# Patient Record
Sex: Female | Born: 2000 | Race: Black or African American | Hispanic: No | Marital: Single | Smoking: Never smoker
Health system: Southern US, Community
[De-identification: ages and names within clinical notes are randomized; demographics above are authoritative.]

## PROBLEM LIST (undated history)

## (undated) ENCOUNTER — Ambulatory Visit: Discharge status: Absent without leave

## (undated) DIAGNOSIS — D693 Immune thrombocytopenic purpura: Secondary | ICD-10-CM

## (undated) HISTORY — PX: WISDOM TOOTH EXTRACTION: SHX21

---
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
## 2019-03-29 DIAGNOSIS — H5213 Myopia, bilateral: Secondary | ICD-10-CM | POA: Diagnosis not present

## 2019-04-21 DIAGNOSIS — H5213 Myopia, bilateral: Secondary | ICD-10-CM | POA: Diagnosis not present

## 2019-04-21 DIAGNOSIS — H52223 Regular astigmatism, bilateral: Secondary | ICD-10-CM | POA: Diagnosis not present

## 2019-10-26 ENCOUNTER — Ambulatory Visit
Admission: EM | Admit: 2019-10-26 | Discharge: 2019-10-26 | Disposition: A | Discharge status: Home / Self Care | Attending: Emergency Medicine | Admitting: Emergency Medicine

## 2019-10-26 ENCOUNTER — Other Ambulatory Visit: Payer: Self-pay

## 2019-10-26 DIAGNOSIS — Z20822 Contact with and (suspected) exposure to covid-19: Secondary | ICD-10-CM

## 2019-10-26 DIAGNOSIS — Z0189 Encounter for other specified special examinations: Secondary | ICD-10-CM

## 2019-10-26 NOTE — Discharge Instructions (Signed)

## 2019-10-26 NOTE — ED Triage Notes (Signed)
Pt presents to [facility] for covid test. Denies exposure or symptoms.

## 2019-10-26 NOTE — ED Provider Notes (Signed)
[facility]   [id number] 10/26/19 Arrival Time: 1448   CC: COVID test  SUBJECTIVE: History from: patient.  STEPHENIA VOGAN is a 19 y.o. female who presents for COVID testing.  Denies sick exposure to COVID, flu or strep.  Denies recent travel.  Denies aggravating or alleviating symptoms.  Denies previous COVID infection.   Denies fever, chills, fatigue, nasal congestion, rhinorrhea, sore throat, cough, SOB, wheezing, chest pain, nausea, vomiting, changes in bowel or bladder habits.     ROS: As per HPI.  All other pertinent ROS negative.     Past Medical History:  Diagnosis Date  . ITP (idiopathic thrombocytopenic purpura)    Past Surgical History:  Procedure Laterality Date  . WISDOM TOOTH EXTRACTION     Allergies  Allergen Reactions  . Penicillins    No current facility-administered medications on file prior to encounter.   No current outpatient medications on file prior to encounter.   Social History   Socioeconomic History  . Marital status: Single    Spouse name: Not on file  . Number of children: Not on file  . Years of education: Not on file  . Highest education level: Not on file  Occupational History  . Not on file  Tobacco Use  . Smoking status: Never Smoker  . Smokeless tobacco: Never Used  Substance and Sexual Activity  . Alcohol use: No  . Drug use: No  . Sexual activity: Not on file  Other Topics Concern  . Not on file  Social History Narrative  . Not on file   Social Determinants of Health   Financial Resource Strain:   . Difficulty of Paying Living Expenses: Not on file  Food Insecurity:   . Worried About Programme researcher, broadcasting/film/video in the Last Year: Not on file  . Ran Out of Food in the Last Year: Not on file  Transportation Needs:   . Lack of Transportation (Medical): Not on file  . Lack of Transportation (Non-Medical): Not on file  Physical Activity:   . Days of Exercise per Week: Not on file  . Minutes of Exercise per Session: Not  on file  Stress:   . Feeling of Stress : Not on file  Social Connections:   . Frequency of Communication with Friends and Family: Not on file  . Frequency of Social Gatherings with Friends and Family: Not on file  . Attends Religious Services: Not on file  . Active Member of Clubs or Organizations: Not on file  . Attends Banker Meetings: Not on file  . Marital Status: Not on file  Intimate Partner Violence:   . Fear of Current or Ex-Partner: Not on file  . Emotionally Abused: Not on file  . Physically Abused: Not on file  . Sexually Abused: Not on file   Family History  Problem Relation Age of Onset  . Healthy Mother   . Healthy Father     OBJECTIVE:  Vitals:   10/26/19 1511  BP: 130/75  Pulse: 82  Resp: 17  Temp: 98.5 F (36.9 C)  TempSrc: Tympanic  SpO2: 96%     General appearance: alert; well-appearing, nontoxic; speaking in full sentences and tolerating own secretions HEENT: NCAT; Ears: EACs clear, TMs pearly gray; Eyes: PERRL.  EOM grossly intact Nose: nares patent without rhinorrhea, Throat: oropharynx clear, tonsils non erythematous or enlarged, uvula midline  Neck: supple without LAD Lungs: unlabored respirations, symmetrical air entry; cough: absent; no respiratory distress; CTAB Heart: regular rate  and rhythm.  Skin: warm and dry Psychological: alert and cooperative; normal mood and affect   ASSESSMENT & PLAN:  1. Encounter for laboratory test   2. Suspected COVID-19 virus infection   3. Encounter for laboratory testing for COVID-19 virus    COVID testing ordered.  It will take between 5-7 days for test results.  Someone will contact you regarding abnormal results.    In the meantime: You should remain isolated in your home for 10 days from symptom onset AND greater than 72 hours after symptoms resolution (absence of fever without the use of fever-reducing medication and improvement in respiratory symptoms), whichever is longer OR 14 days  from exposure Get plenty of rest and push fluids Use OTC zyrtec for nasal congestion, runny nose, and/or sore throat Use OTC flonase for nasal congestion and runny nose Use medications daily for symptom relief Use OTC medications like ibuprofen or tylenol as needed fever or pain Call or go to the ED if you have any new or worsening symptoms such as fever, cough, shortness of breath, chest tightness, chest pain, turning blue, changes in mental status, etc...    Reviewed expectations re: course of current medical issues. Questions answered. Outlined signs and symptoms indicating need for more acute intervention. Patient verbalized understanding. After Visit Summary given.         Lestine Box, PA-C 10/26/19 1611

## 2019-10-27 LAB — NOVEL CORONAVIRUS, NAA: SARS-CoV-2, NAA: NOT DETECTED

## 2019-11-13 ENCOUNTER — Other Ambulatory Visit: Payer: Self-pay

## 2019-11-13 ENCOUNTER — Ambulatory Visit: Attending: Internal Medicine

## 2019-11-13 DIAGNOSIS — Z20822 Contact with and (suspected) exposure to covid-19: Secondary | ICD-10-CM

## 2019-11-14 LAB — NOVEL CORONAVIRUS, NAA: SARS-CoV-2, NAA: NOT DETECTED

## 2020-01-23 ENCOUNTER — Other Ambulatory Visit: Payer: Self-pay

## 2020-01-23 ENCOUNTER — Ambulatory Visit: Attending: Internal Medicine

## 2020-01-23 DIAGNOSIS — Z20822 Contact with and (suspected) exposure to covid-19: Secondary | ICD-10-CM

## 2020-01-24 LAB — SARS-COV-2, NAA 2 DAY TAT

## 2020-01-24 LAB — NOVEL CORONAVIRUS, NAA: SARS-CoV-2, NAA: NOT DETECTED

## 2020-01-26 ENCOUNTER — Encounter (HOSPITAL_COMMUNITY): Payer: Self-pay

## 2020-01-26 ENCOUNTER — Other Ambulatory Visit: Payer: Self-pay

## 2020-01-26 ENCOUNTER — Emergency Department (HOSPITAL_COMMUNITY)
Admission: EM | Admit: 2020-01-26 | Discharge: 2020-01-26 | Disposition: A | Discharge status: Home / Self Care | Attending: Emergency Medicine | Admitting: Emergency Medicine

## 2020-01-26 DIAGNOSIS — M542 Cervicalgia: Secondary | ICD-10-CM | POA: Diagnosis present

## 2020-01-26 MED ORDER — NAPROXEN 250 MG PO TABS
375.0000 mg | ORAL_TABLET | Freq: Once | ORAL | Status: AC
  Administered 2020-01-26: 09:00:00 375 mg via ORAL
  Filled 2020-01-26: qty 2

## 2020-01-26 MED ORDER — LIDOCAINE 5 % EX PTCH: 1.0000 | MEDICATED_PATCH | CUTANEOUS | 0 refills | Status: AC

## 2020-01-26 MED ORDER — LIDOCAINE 5 % EX PTCH
1.0000 | MEDICATED_PATCH | CUTANEOUS | Status: DC
  Administered 2020-01-26: 09:00:00 1 via TRANSDERMAL
  Filled 2020-01-26: qty 1

## 2020-01-26 MED ORDER — NAPROXEN 375 MG PO TABS: 375.0000 mg | ORAL_TABLET | Freq: Two times a day (BID) | ORAL | 0 refills | Status: AC | PRN

## 2020-01-26 NOTE — ED Triage Notes (Signed)
Pt reports left sided neck pain for past few days.  Pt says woke up this morning and felt something pop in left side of neck and pain has been worse since then.

## 2020-01-26 NOTE — ED Provider Notes (Signed)
[facility] EMERGENCY DEPARTMENT Provider Note   CSN: [id number] Arrival date & time: 01/26/20  5329     History Chief Complaint  Patient presents with  . Neck Pain    Tammie Barber is a 19 y.o. female with a history of ITP who presents to the emergency department with complaints of neck pain for the past few days that worsened this morning.  Patient states the pain is to the left side of her neck, was mild, this morning while in bed she felt a popping sensation to the left side of the neck with worsening pain.  States it feels like it is stiff.  Pain is worse with movement of the neck as well as the left shoulder.  No alleviating factors.  No intervention prior to arrival.  No traumatic injury or change in activity. Denies numbness, tingling, weakness, saddle anesthesia, incontinence to bowel/bladder, fever, chills, IV drug use, dysuria, or hx of cancer. Patient has not had prior back surgeries.   HPI     Past Medical History:  Diagnosis Date  . ITP (idiopathic thrombocytopenic purpura)     Patient Active Problem List   Diagnosis Date Noted  . 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 WISDOM TOOTH EXTRACTION       OB History   No obstetric history on file.     Family History  Problem Relation Age of Onset  . Healthy Mother   . Healthy Father     Social History   Tobacco Use  . Smoking status: Never Smoker  . Smokeless tobacco: Never Used  Substance Use Topics  . Alcohol use: No  . Drug use: No    Home Medications Prior to Admission medications   Not on File    Allergies    Penicillins  Review of Systems   Review of Systems  Constitutional: Negative for chills and fever.  Eyes: Negative for visual disturbance.  Respiratory: Negative for shortness of breath.   Cardiovascular: Negative for chest pain.  Gastrointestinal: Negative for abdominal pain, nausea and vomiting.  Musculoskeletal:  Positive for neck pain.  Neurological: Negative for dizziness, weakness, numbness and headaches.       Negative for incontinence or saddle anesthesia.     Physical Exam Updated Vital Signs BP (!) 134/98 (BP Location: Right Arm)   Pulse 90   Temp 98.5 F (36.9 C) (Oral)   Resp 12   Ht 5\' 8"  (1.727 m)   Wt 86.2 kg   LMP 01/19/2020 (Approximate)   SpO2 100%   BMI 28.89 kg/m   Physical Exam Vitals and nursing note reviewed.  Constitutional:      General: She is not in acute distress.    Appearance: Normal appearance. She is well-developed. She is not ill-appearing or toxic-appearing.  HENT:     Head: Normocephalic and atraumatic.  Eyes:     General:        Right eye: No discharge.        Left eye: No discharge.     Conjunctiva/sclera: Conjunctivae normal.  Neck:     Comments: ROM intact. No point/focal vertebral tenderness. Tender to palpation to the L cervical paraspinal muscles.  Cardiovascular:     Rate and Rhythm: Normal rate and regular rhythm.     Pulses:          Radial pulses are 2+ on the right side and 2+ on the left side.  Pulmonary:  Effort: Pulmonary effort is normal. No respiratory distress.     Breath sounds: Normal breath sounds. No wheezing, rhonchi or rales.  Abdominal:     General: There is no distension.     Palpations: Abdomen is soft.     Tenderness: There is no abdominal tenderness.  Musculoskeletal:     Cervical back: Neck supple. No rigidity.     Comments: Upper extremities: No obvious deformity, appreciable swelling, edema, erythema, ecchymosis, warmth, or open wounds. Patient has intact AROM throughout.  Tender to palpation over the L trapezius, otherwise nontender, no focal bony tenderness noted.  No midline spinal tenderness.   Skin:    General: Skin is warm and dry.     Capillary Refill: Capillary refill takes less than 2 seconds.     Findings: No rash.  Neurological:     Mental Status: She is alert.     Comments: Alert. Clear speech.  CN III-XII grossly intact. Sensation grossly intact to bilateral upper/lower extremities. 5/5 symmetric grip strength & strength with plantar/dorsiflexion bilaterally. Ambulatory.   Psychiatric:        Mood and Affect: Mood normal.        Behavior: Behavior normal.     ED Results / Procedures / Treatments   Labs (all labs ordered are listed, but only abnormal results are displayed) Labs Reviewed - No data to display  EKG None  Radiology No results found.  Procedures Procedures (including critical care time)  Medications Ordered in ED Medications - No data to display  ED Course  I have reviewed the triage vital signs and the nursing notes.  Pertinent labs & imaging results that were available during my care of the patient were reviewed by me and considered in my medical decision making (see chart for details).    MDM Rules/Calculators/A&P                      Patient presents to the emergency department with complaints of neck pain over the past few days, worsened with a popping sensation this morning.  Nontoxic, resting comfortably, vitals WNL with exception of mildly elevated BP, doubt HTN emergency.  No direct trauma, no focal midline tenderness, do not suspect spinal fracture/subluxation.  Afebrile, no history of IVDU, do not suspect epidural abscess, cord rigidity do not suspect meningitis.  Pain is in the neck, no neurologic deficits, ambulatory, do not suspect cauda equina.  Pain is reproducible with left sided cervical paraspinal muscle palpation. Suspect muscle strain/spasm- will treat with naproxen & lidoderm patches. Recommended application of heat. PCP follow up. I discussed treatment plan, need for follow-up, and return precautions with the patient. Provided opportunity for questions, patient confirmed understanding and is in agreement with plan.   Final Clinical Impression(s) / ED Diagnoses Final diagnoses:  Neck pain    Rx / DC Orders ED Discharge Orders          Ordered    lidocaine (LIDODERM) 5 %  Every 24 hours     01/26/20 0900    naproxen (NAPROSYN) 375 MG tablet  2 times daily PRN     01/26/20 0901           Cherly Anderson, PA-C 01/26/20 0906    Sabas Sous, MD 01/27/20 1239

## 2020-01-26 NOTE — Discharge Instructions (Signed)
You were seen in the ER today for neck pain.  We suspect this is related to a muscle strain/spasm.  We are sending you home with the following medicines:  - Naproxen is a nonsteroidal anti-inflammatory medication that will help with pain and swelling. Be sure to take this medication as prescribed with food, 1 pill every 12 hours,  It should be taken with food, as it can cause stomach upset, and more seriously, stomach bleeding. Do not take other nonsteroidal anti-inflammatory medications with this such as Advil, Motrin, Aleve, Mobic, Goodie Powder, or Motrin.    - Lidoderm patch- apply 1 patch to the left side of your neck daily to help with muscle spasm.   You make take Tylenol per over the counter dosing with these medications.   We have prescribed you new medication(s) today. Discuss the medications prescribed today with your pharmacist as they can have adverse effects and interactions with your other medicines including over the counter and prescribed medications. Seek medical evaluation if you start to experience new or abnormal symptoms after taking one of these medicines, seek care immediately if you start to experience difficulty breathing, feeling of your throat closing, facial swelling, or rash as these could be indications of a more serious allergic reaction   Apply heat to the left side of your neck as this may help loosen the muscle as well.  Follow with your primary care provider within 3 days for reevaluation.  If you do not have a primary care provider please see local options listed below.  Return to the ER for new or worsening symptoms including but not limited to increased pain, numbness, weakness, fever, loss of control of bowel or bladder function, change in your vision, or any other concern.  [facility] Primary Care Doctor List    Kari Baars MD. Specialty: Pulmonary Disease Contact information: [address]  [address]  [place] [address]  [phone]    Syliva Overman, MD. Specialty: [facility] Contact information: [address]  [place] [address]  [phone]   Lilyan Punt, MD. Specialty: [facility] Contact information: [address]  [place] [address]  [phone]   Avon Gully, MD Specialty: Internal Medicine Contact information: [address] [place] [address]  [phone]   Catalina Pizza, MD. Specialty: Internal Medicine Contact information: [address]  [place] [address]  [phone]    [facility] (Dr. Selena Batten) Specialty: Family Medicine Contact information: [address]  [place] [address]  [phone]   John Giovanni, MD. Specialty: [facility] Contact information: [address]  [address]  [place] [address]  [phone]   Carylon Perches, MD. Specialty: Internal Medicine Contact information: [address]  [address]  [place] [address]  [phone]    [facility]  [address] [place], [place] [address] [phone]  Services [facility] offers a variety of basic health services.  Services include but are not limited to: Blood pressure checks  Heart rate checks  Blood sugar checks  Urine analysis  Rapid strep tests  Pregnancy tests.  Health education and referrals  People needing more complex services will be directed to a physician online. Using these virtual visits, doctors can evaluate and prescribe medicine and treatments. There will be no medication on-site, though [facility] will help patients fill their prescriptions at  little to no cost.   For More information please go to: [email or web address]

## 2020-07-11 ENCOUNTER — Other Ambulatory Visit: Payer: Self-pay

## 2020-07-11 ENCOUNTER — Ambulatory Visit
Admission: EM | Admit: 2020-07-11 | Discharge: 2020-07-11 | Disposition: A | Discharge status: Home / Self Care | Attending: Emergency Medicine | Admitting: Emergency Medicine

## 2020-07-11 ENCOUNTER — Encounter: Payer: Self-pay | Admitting: Emergency Medicine

## 2020-07-11 DIAGNOSIS — J039 Acute tonsillitis, unspecified: Secondary | ICD-10-CM

## 2020-07-11 DIAGNOSIS — R509 Fever, unspecified: Secondary | ICD-10-CM | POA: Diagnosis not present

## 2020-07-11 DIAGNOSIS — Z1152 Encounter for screening for COVID-19: Secondary | ICD-10-CM | POA: Diagnosis present

## 2020-07-11 LAB — POCT RAPID STREP A (OFFICE): Rapid Strep A Screen: NEGATIVE

## 2020-07-11 MED ORDER — AZITHROMYCIN 250 MG PO TABS: 250.0000 mg | ORAL_TABLET | Freq: Every day | ORAL | 0 refills | Status: AC

## 2020-07-11 NOTE — Discharge Instructions (Signed)
COVID testing ordered.  It will take between 2-7 days for test results.  Someone will contact you regarding abnormal results.    In the meantime: You should remain isolated in your home for 10 days from symptom onset AND greater than 24 hours after symptoms resolution (absence of fever without the use of fever-reducing medication and improvement in respiratory symptoms), whichever is longer Get plenty of rest and push fluids Azithromycin were prescribed for possible tonsillitis Use throat lozenges to soothe your throat Use medications daily for symptom relief Use OTC medications like ibuprofen or tylenol as needed fever or pain Call or go to the ED if you have any new or worsening symptoms such as fever, worsening cough, shortness of breath, chest tightness, chest pain, turning blue, changes in mental status, etc..Marland Kitchen

## 2020-07-11 NOTE — ED Triage Notes (Signed)
Chills, fever, sore throat and upper back pain that started last night. Pt states her temp was 102 around 0830 took 2 extra strength tylenol.

## 2020-07-11 NOTE — ED Provider Notes (Signed)
Back pain [facility]   [id number] 07/11/20 Arrival Time: 0857   CC: COVID symptoms  SUBJECTIVE: History from: patient.  Tammie Barber is a 19 y.o. female presented to the urgent care for complaint of chills, fever, sore throat that started last night.  Reported fever of 102 F at home, 99.5 F in office today.  Denies sick exposure to COVID, flu or strep.  Denies recent travel.  Has tried OTC medication without relief.  Denies aggravating factors.  Denies previous symptoms in the past.   Denies fever, chills, fatigue, sinus pain, rhinorrhea, sore throat, SOB, wheezing, chest pain, nausea, changes in bowel or bladder habits.      ROS: As per HPI.  All other pertinent ROS negative.     Past Medical History:  Diagnosis Date  . ITP (idiopathic thrombocytopenic purpura)    Past Surgical History:  Procedure Laterality Date  . WISDOM TOOTH EXTRACTION     Allergies  Allergen Reactions  . Penicillins    No current facility-administered medications on file prior to encounter.   Current Outpatient Medications on File Prior to Encounter  Medication Sig Dispense Refill  . lidocaine (LIDODERM) 5 % Place 1 patch onto the skin daily. Apply patch to the left side of your neck once per day. Remove & Discard patch within 12 hours. 15 patch 0  . naproxen (NAPROSYN) 375 MG tablet Take 1 tablet (375 mg total) by mouth 2 (two) times daily as needed for moderate pain. 10 tablet 0   Social History   Socioeconomic History  . Marital status: Single    Spouse name: Not on file  . Number of children: Not on file  . Years of education: Not on file  . Highest education level: Not on file  Occupational History  . Not on file  Tobacco Use  . Smoking status: Never Smoker  . Smokeless tobacco: Never Used  Vaping Use  . Vaping Use: Never used  Substance and Sexual Activity  . Alcohol use: No  . Drug use: No  . Sexual activity: Not on file  Other Topics Concern  . Not on file  Social  History Narrative  . Not on file   Social Determinants of Health   Financial Resource Strain:   . Difficulty of Paying Living Expenses: Not on file  Food Insecurity:   . Worried About Programme researcher, broadcasting/film/video in the Last Year: Not on file  . Ran Out of Food in the Last Year: Not on file  Transportation Needs:   . Lack of Transportation (Medical): Not on file  . Lack of Transportation (Non-Medical): Not on file  Physical Activity:   . Days of Exercise per Week: Not on file  . Minutes of Exercise per Session: Not on file  Stress:   . Feeling of Stress : Not on file  Social Connections:   . Frequency of Communication with Friends and Family: Not on file  . Frequency of Social Gatherings with Friends and Family: Not on file  . Attends Religious Services: Not on file  . Active Member of Clubs or Organizations: Not on file  . Attends Banker Meetings: Not on file  . Marital Status: Not on file  Intimate Partner Violence:   . Fear of Current or Ex-Partner: Not on file  . Emotionally Abused: Not on file  . Physically Abused: Not on file  . Sexually Abused: Not on file   Family History  Problem Relation Age of Onset  .  Healthy Mother   . Healthy Father     OBJECTIVE:  Vitals:   07/11/20 0938 07/11/20 0939  BP: 113/74   Pulse: (!) 125   Resp: 18   Temp: 99.5 F (37.5 C)   TempSrc: Oral   SpO2: 96%   Weight:  210 lb (95.3 kg)  Height:  5\' 8"  (1.727 m)     General appearance: alert; appears fatigued, but nontoxic; speaking in full sentences and tolerating own secretions HEENT: NCAT; Ears: EACs clear, TMs pearly gray; Eyes: PERRL.  EOM grossly intact. Sinuses: nontender; Nose: nares patent without rhinorrhea, Throat: oropharynx clear, 2+ tonsils  erythematous with white exudate, uvula midline  Neck: supple without LAD Lungs: unlabored respirations, symmetrical air entry; cough: mild; no respiratory distress; CTAB Heart: regular rate and rhythm.  Radial pulses 2+  symmetrical bilaterally Skin: warm and dry Psychological: alert and cooperative; normal mood and affect  LABS:  Results for orders placed or performed during the hospital encounter of 07/11/20 (from the past 24 hour(s))  POCT rapid strep A     Status: None   Collection Time: 07/11/20  9:47 AM  Result Value Ref Range   Rapid Strep A Screen Negative Negative     ASSESSMENT & PLAN:  1. Fever, unspecified   2. Acute tonsillitis, unspecified etiology   3. Encounter for screening for COVID-19     Meds ordered this encounter  Medications  . azithromycin (ZITHROMAX) 250 MG tablet    Sig: Take 1 tablet (250 mg total) by mouth daily. Take first 2 tablets together, then 1 every day until finished.    Dispense:  6 tablet    Refill:  0   Patient is stable at discharge.  COVID-19 test was completed for rule out.  Discharge instructions  COVID testing ordered.  It will take between 2-7 days for test results.  Someone will contact you regarding abnormal results.    In the meantime: You should remain isolated in your home for 10 days from symptom onset AND greater than 24 hours after symptoms resolution (absence of fever without the use of fever-reducing medication and improvement in respiratory symptoms), whichever is longer Get plenty of rest and push fluids Azithromycin were prescribed for possible tonsillitis Use throat lozenges to soothe your throat Use medications daily for symptom relief Use OTC medications like ibuprofen or tylenol as needed fever or pain Call or go to the ED if you have any new or worsening symptoms such as fever, worsening cough, shortness of breath, chest tightness, chest pain, turning blue, changes in mental status, etc...   Reviewed expectations re: course of current medical issues. Questions answered. Outlined signs and symptoms indicating need for more acute intervention. Patient verbalized understanding. After Visit Summary given.         07/13/20, FNP 07/11/20 1024

## 2020-07-12 LAB — NOVEL CORONAVIRUS, NAA: SARS-CoV-2, NAA: NOT DETECTED

## 2020-07-12 LAB — SARS-COV-2, NAA 2 DAY TAT

## 2020-07-14 LAB — CULTURE, GROUP A STREP (THRC)

## 2020-09-16 ENCOUNTER — Other Ambulatory Visit: Payer: Self-pay

## 2020-09-16 ENCOUNTER — Ambulatory Visit
Admission: EM | Admit: 2020-09-16 | Discharge: 2020-09-16 | Disposition: A | Discharge status: Home / Self Care | Attending: Family Medicine | Admitting: Family Medicine

## 2020-09-16 ENCOUNTER — Encounter: Payer: Self-pay | Admitting: Family Medicine

## 2020-09-16 DIAGNOSIS — Z20822 Contact with and (suspected) exposure to covid-19: Secondary | ICD-10-CM

## 2020-09-16 DIAGNOSIS — Z1152 Encounter for screening for COVID-19: Secondary | ICD-10-CM

## 2020-09-16 NOTE — ED Provider Notes (Signed)
[facility]    CSN: [id number] Arrival date & time: 09/16/20  1637      History   Chief Complaint Chief Complaint  Patient presents with  . URI    HPI Tammie Barber is a 19 y.o. female.   HPI   Encounter for COVID-19 Testing in Symptomatic Patient Recent Exposure to persons positive for COVID-19: Yes Experienced Fever:No Current Symptoms: Symptoms of nasal congestion Last COVID-19 Test: Positive home test today     Past Medical History:  Diagnosis Date  . ITP (idiopathic thrombocytopenic purpura)     Patient Active Problem List   Diagnosis Date Noted  . 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 WISDOM TOOTH EXTRACTION      OB History   No obstetric history on file.      Home Medications    Prior to Admission medications   Medication Sig Start Date End Date Taking? Authorizing Provider  azithromycin (ZITHROMAX) 250 MG tablet Take 1 tablet (250 mg total) by mouth daily. Take first 2 tablets together, then 1 every day until finished. 07/11/20   Avegno, Zachery Dakins, FNP  lidocaine (LIDODERM) 5 % Place 1 patch onto the skin daily. Apply patch to the left side of your neck once per day. Remove & Discard patch within 12 hours. 01/26/20   Petrucelli, Samantha R, PA-C  naproxen (NAPROSYN) 375 MG tablet Take 1 tablet (375 mg total) by mouth 2 (two) times daily as needed for moderate pain. 01/26/20   Petrucelli, Pleas Koch, PA-C    Family History Family History  Problem Relation Age of Onset  . Healthy Mother   . Healthy Father     Social History Social History   Tobacco Use  . Smoking status: Never Smoker  . Smokeless tobacco: Never Used  Vaping Use  . Vaping Use: Never used  Substance Use Topics  . Alcohol use: No  . Drug use: No     Allergies   Penicillins   Review of Systems Review of Systems Pertinent negatives listed in HPI   Physical Exam Triage Vital Signs ED  Triage Vitals  Enc Vitals Group     BP 09/16/20 1806 111/78     Pulse Rate 09/16/20 1806 78     Resp 09/16/20 1806 16     Temp 09/16/20 1806 98 F (36.7 C)     Temp Source 09/16/20 1806 Oral     SpO2 09/16/20 1806 97 %     Weight --      Height --      Head Circumference --      Peak Flow --      Pain Score 09/16/20 1810 0     Pain Loc --      Pain Edu? --      Excl. in GC? --    No data found.  Updated Vital Signs BP 111/78 (BP Location: Right Arm)   Pulse 78   Temp 98 F (36.7 C) (Oral)   Resp 16   LMP 08/26/2020   SpO2 97%   Visual Acuity Right Eye Distance:   Left Eye Distance:   Bilateral Distance:    Right Eye Near:   Left Eye Near:    Bilateral Near:     Physical Exam General appearance: alert, cooperative and in no distress Head: Normocephalic, without obvious abnormality, atraumatic Nasal congestion present  Respiratory: Respirations even and unlabored, normal respiratory rate Heart:  rate and rhythm normal. No gallop or murmurs noted on exam  Abdomen: BS +, no distention, no rebound tenderness, or no mass Extremities: No gross deformities Skin: Skin color, texture, turgor normal. No rashes seen  Psych: Appropriate mood and affect UC Treatments / Results  Labs (all labs ordered are listed, but only abnormal results are displayed) Labs Reviewed  NOVEL CORONAVIRUS, NAA    EKG   Radiology No results found.  Procedures Procedures (including critical care time)  Medications Ordered in UC Medications - No data to display  Initial Impression / Assessment and Plan / UC Course  I have reviewed the triage vital signs and the nursing notes.  Pertinent labs & imaging results that were available during my care of the patient were reviewed by me and considered in my medical decision making (see chart for details).    Consider home test positive. PCR COVID Test pending. Encouraged hydration with water. Encouraged deep breathing exercises. Avoid  social interaction as you are still symptomatic.  Patient advised that he should consider himself infected until he is symptom free for 72 hours. Advised to go immediately to the ER if symptoms worsen or do not improve.  Final Clinical Impressions(s) / UC Diagnoses   Final diagnoses:  Encounter for screening for COVID-19     Discharge Instructions     Quarantine until results are known . 10 days quarantine if test is positive    ED Prescriptions    None     PDMP not reviewed this encounter.   Bing Neighbors, FNP 09/16/20 1821

## 2020-09-16 NOTE — ED Triage Notes (Signed)
Patient has a stuffy nose for 2 days.  Patient mother and grandmother live together.  They all took a home covid test today.  2 of the 3 tests were positive, including this patient.

## 2020-09-16 NOTE — Discharge Instructions (Addendum)
Your COVID 19 results will be available in 48-72 hours. Negative results are immediately resulted to Mychart. Positive results will receive a follow-up call from our clinic. If symptoms are present, I recommend home quarantine until results are known.  

## 2020-09-18 LAB — NOVEL CORONAVIRUS, NAA: SARS-CoV-2, NAA: DETECTED — AB

## 2020-09-18 LAB — SARS-COV-2, NAA 2 DAY TAT

## 2020-09-21 ENCOUNTER — Telehealth (HOSPITAL_COMMUNITY): Payer: Self-pay

## 2020-09-21 ENCOUNTER — Encounter (HOSPITAL_COMMUNITY): Payer: Self-pay

## 2021-02-12 ENCOUNTER — Emergency Department (HOSPITAL_COMMUNITY)

## 2021-02-12 ENCOUNTER — Ambulatory Visit: Admission: EM | Admit: 2021-02-12 | Discharge: 2021-02-12 | Disposition: A | Discharge status: Home / Self Care

## 2021-02-12 ENCOUNTER — Emergency Department (HOSPITAL_COMMUNITY)
Admission: EM | Admit: 2021-02-12 | Discharge: 2021-02-12 | Disposition: A | Discharge status: Home / Self Care | Attending: Emergency Medicine | Admitting: Emergency Medicine

## 2021-02-12 ENCOUNTER — Encounter (HOSPITAL_COMMUNITY): Payer: Self-pay

## 2021-02-12 ENCOUNTER — Other Ambulatory Visit: Payer: Self-pay

## 2021-02-12 ENCOUNTER — Encounter: Payer: Self-pay | Admitting: Emergency Medicine

## 2021-02-12 DIAGNOSIS — H538 Other visual disturbances: Secondary | ICD-10-CM

## 2021-02-12 DIAGNOSIS — R42 Dizziness and giddiness: Secondary | ICD-10-CM | POA: Diagnosis present

## 2021-02-12 DIAGNOSIS — R110 Nausea: Secondary | ICD-10-CM

## 2021-02-12 LAB — CBC WITH DIFFERENTIAL/PLATELET
Abs Immature Granulocytes: 0.01 10*3/uL (ref 0.00–0.07)
Basophils Absolute: 0.1 10*3/uL (ref 0.0–0.1)
Basophils Relative: 1 %
Eosinophils Absolute: 0.1 10*3/uL (ref 0.0–0.5)
Eosinophils Relative: 1 %
HCT: 33.4 % — ABNORMAL LOW (ref 36.0–46.0)
Hemoglobin: 10.4 g/dL — ABNORMAL LOW (ref 12.0–15.0)
Immature Granulocytes: 0 %
Lymphocytes Relative: 31 %
Lymphs Abs: 2.1 10*3/uL (ref 0.7–4.0)
MCH: 27.2 pg (ref 26.0–34.0)
MCHC: 31.1 g/dL (ref 30.0–36.0)
MCV: 87.4 fL (ref 80.0–100.0)
Monocytes Absolute: 0.5 10*3/uL (ref 0.1–1.0)
Monocytes Relative: 7 %
Neutro Abs: 4.2 10*3/uL (ref 1.7–7.7)
Neutrophils Relative %: 60 %
Platelets: 322 10*3/uL (ref 150–400)
RBC: 3.82 MIL/uL — ABNORMAL LOW (ref 3.87–5.11)
RDW: 13.5 % (ref 11.5–15.5)
WBC: 6.9 10*3/uL (ref 4.0–10.5)
nRBC: 0 % (ref 0.0–0.2)

## 2021-02-12 LAB — COMPREHENSIVE METABOLIC PANEL
ALT: 11 U/L (ref 0–44)
AST: 13 U/L — ABNORMAL LOW (ref 15–41)
Albumin: 4.1 g/dL (ref 3.5–5.0)
Alkaline Phosphatase: 60 U/L (ref 38–126)
Anion gap: 6 (ref 5–15)
BUN: 9 mg/dL (ref 6–20)
CO2: 25 mmol/L (ref 22–32)
Calcium: 9.1 mg/dL (ref 8.9–10.3)
Chloride: 107 mmol/L (ref 98–111)
Creatinine, Ser: 0.62 mg/dL (ref 0.44–1.00)
GFR, Estimated: >60 mL/min (ref >60)
Glucose, Bld: 88 mg/dL (ref 70–99)
Potassium: 3.8 mmol/L (ref 3.5–5.1)
Sodium: 138 mmol/L (ref 135–145)
Total Bilirubin: 1.2 mg/dL (ref 0.3–1.2)
Total Protein: 7.3 g/dL (ref 6.5–8.1)

## 2021-02-12 LAB — TROPONIN I (HIGH SENSITIVITY): Troponin I (High Sensitivity): <2 ng/L (ref <18)

## 2021-02-12 LAB — PREGNANCY, URINE: Preg Test, Ur: NEGATIVE

## 2021-02-12 MED ORDER — SODIUM CHLORIDE 0.9 % IV BOLUS
1000.0000 mL | Freq: Once | INTRAVENOUS | Status: AC
  Administered 2021-02-12: 1000 mL via INTRAVENOUS

## 2021-02-12 NOTE — ED Notes (Signed)
C/o dizziness at random times, no vomiting, or numbness noted.

## 2021-02-12 NOTE — Discharge Instructions (Addendum)
I suspect your dehydrated causing your dizziness please continue to stay hydrated and continue with your medication as prescribed.    Lab work shows your hemoglobin was slightly low, possible for anemia like you to follow-up with your PCP for further evaluation.  Come back to the emergency department if you develop chest pain, shortness of breath, severe abdominal pain, uncontrolled nausea, vomiting, diarrhea.
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
## 2021-02-12 NOTE — ED Triage Notes (Signed)
Pt to the er, pt states that she is here for dizziness, states that she has been dizzy for the past two day, states that her dizziness is more constant at this time, pt states that she was seen at urgent care and sent to the ed for further eval.  states that the dizziness is random.

## 2021-02-12 NOTE — ED Notes (Signed)
Patient is being discharged from the Urgent Care and sent to the Emergency Department via pov . Per B. Wurst , patient is in need of higher level of care due to dizziness . Patient is aware and verbalizes understanding of plan of care.  Vitals:   02/12/21 1025  BP: 126/86  Pulse: 86  Resp: 16  Temp: 97.6 F (36.4 C)  SpO2: 96%

## 2021-02-12 NOTE — Discharge Instructions (Signed)
Recommending further evaluation and management in the ED given patient's symptoms of feeling like she is going to pass out, nausea, dizziness, and blurred vision.  Exam reassuring and vital signs stable in office.  Offered outpatient therapy for nausea, but patient declines at this time and would like a further work-up.  Cannot rule out brain bleed, immediate electrolyte abnormalities, or anemia in urgent care setting.  Patient aware and in agreement with plan.  Will go by private vehicle to [facility] ED.  Declines EMS transport.

## 2021-02-12 NOTE — ED Triage Notes (Addendum)
Dizziness, blurry vision and nausea for the past 2 days.  Pt does have some nasal drainage as well that started today.  Pt mentioned she has a hx of ITP but has not had any problems since she was a child.

## 2021-02-13 ENCOUNTER — Telehealth: Payer: Self-pay | Admitting: *Deleted

## 2021-02-13 NOTE — Telephone Encounter (Signed)
Transition Care Management Unsuccessful Follow-up Telephone Call  Date of discharge and from where:  02/11/2021 - [place] [facility]  Attempts:  1st Attempt  Reason for unsuccessful TCM follow-up call:  Left voice message 

## 2021-02-14 NOTE — Telephone Encounter (Signed)
Transition Care Management Unsuccessful Follow-up Telephone Call  Date of discharge and from where:  02/11/2021 - [facility]  Attempts:  2nd Attempt  Reason for unsuccessful TCM follow-up call:  Left voice message

## 2021-02-17 NOTE — Telephone Encounter (Signed)
Transition Care Management Unsuccessful Follow-up Telephone Call  Date of discharge and from where:  02/11/2021 from [facility]  Attempts:  3rd Attempt  Reason for unsuccessful TCM follow-up call:  Unable to reach patient

## 2021-11-13 IMAGING — DX DG CHEST 1V PORT
1 series · 1 of 1 positions shown · non-contrast
Comparison: 07/31/2006

CLINICAL DATA: Weakness dizzy

EXAM:
PORTABLE CHEST 1 VIEW

[chest ap]
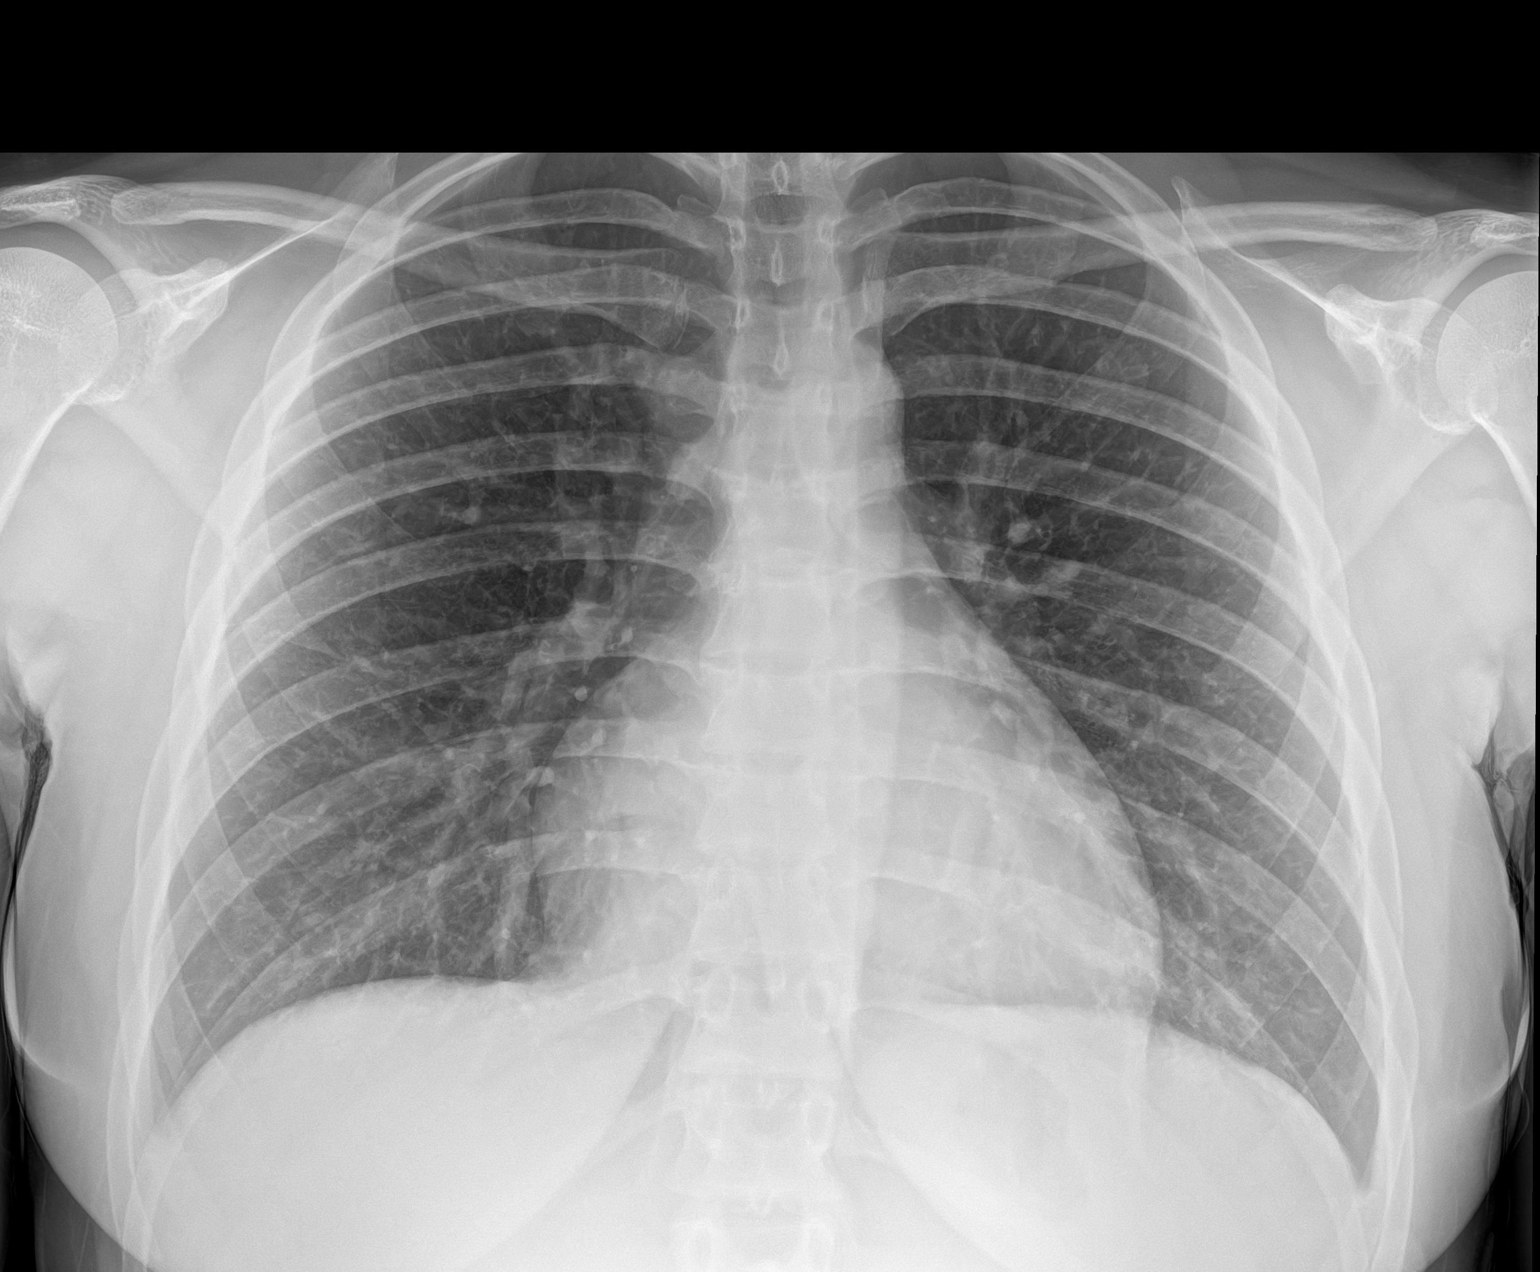

[1 of 1 positions shown; findings below may reference images not displayed]

FINDINGS: The heart size and mediastinal contours are within normal limits.
Both lungs are clear. The visualized skeletal structures are
unremarkable.
IMPRESSION: No active disease.

## 2021-11-13 IMAGING — CT CT HEAD W/O CM
3 series · 15 of 47 positions shown, 18 images · non-contrast
Comparison: None.

CLINICAL DATA: Weakness, dizzy

EXAM:
CT HEAD WITHOUT CONTRAST
TECHNIQUE: Contiguous axial images were obtained from the base of the skull
through the vertex without intravenous contrast.

[Series 2: head w o · axial · 0.44mm/px · z∈[+592,+737]mm · 9 of 35 slices shown, 12 images]
[im 3/35  brain]
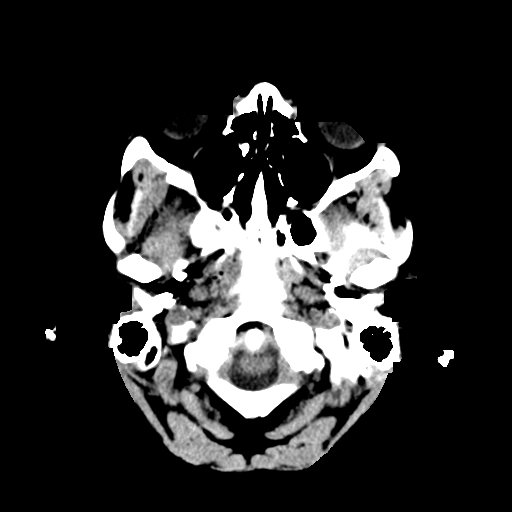
[im 3/35  bone]
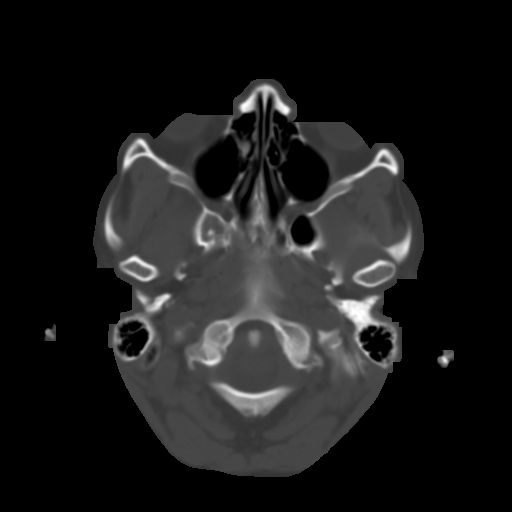
[im 6/35  brain]
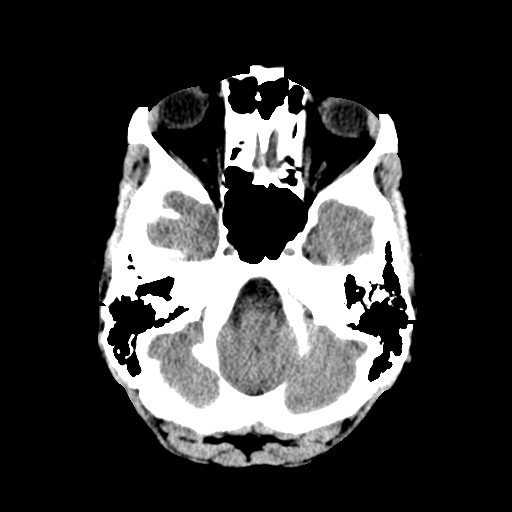
[im 10/35  brain]
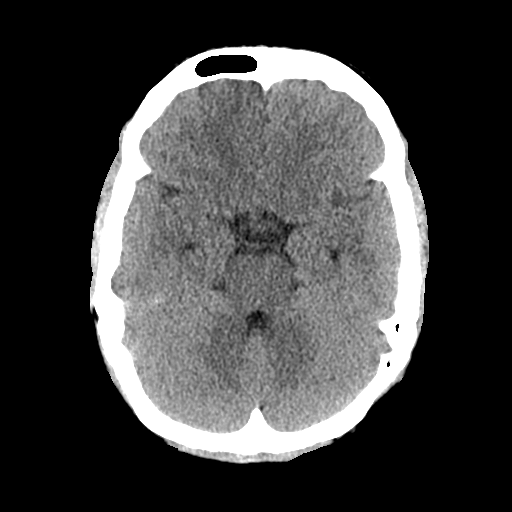
[im 13/35  brain]
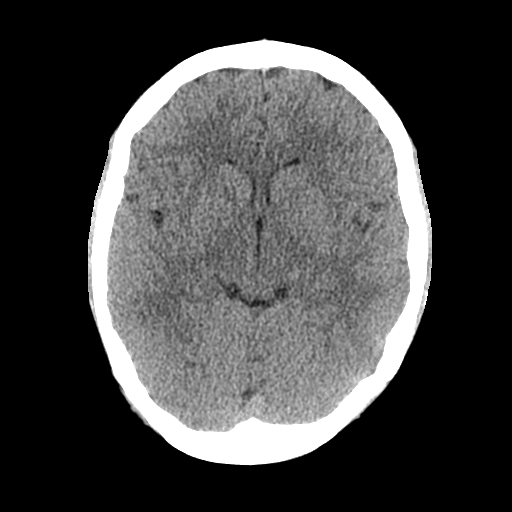
[im 18/35  brain]
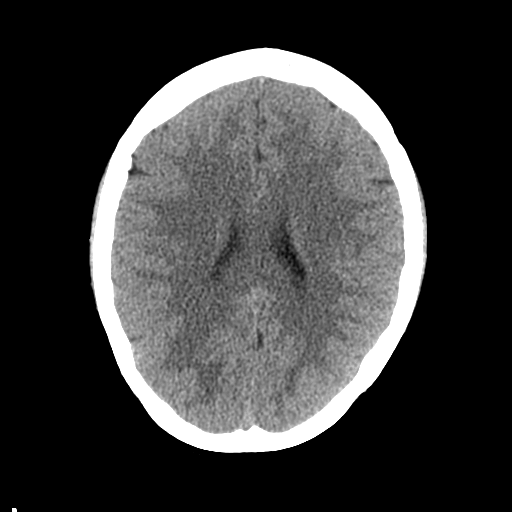
[im 18/35  bone]
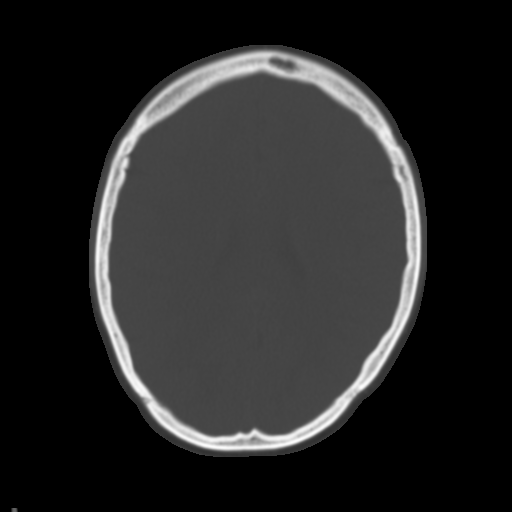
[im 22/35  brain]
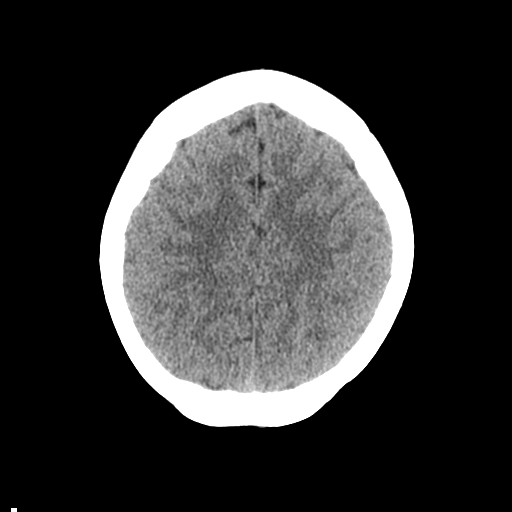
[im 25/35  brain]
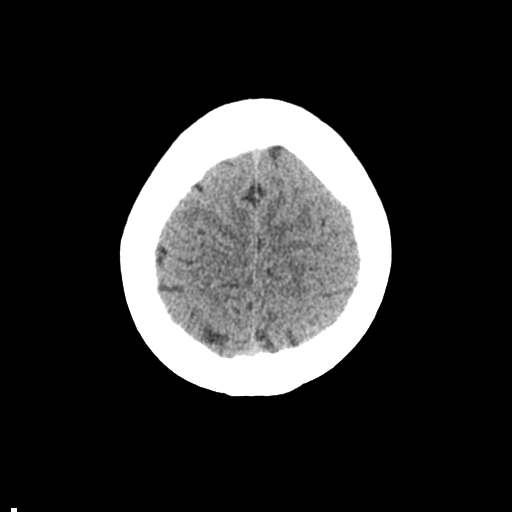
[im 29/35  brain]
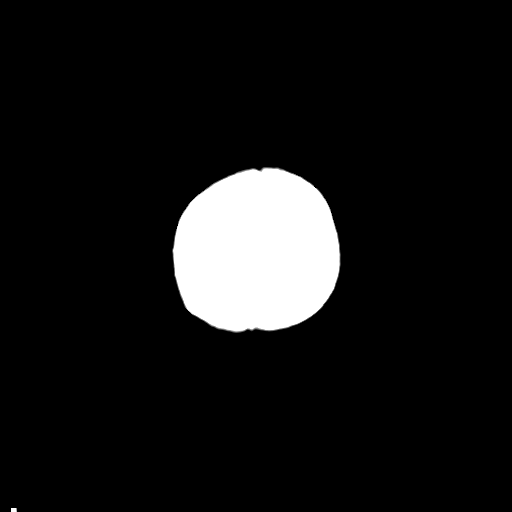
[im 32/35  brain]
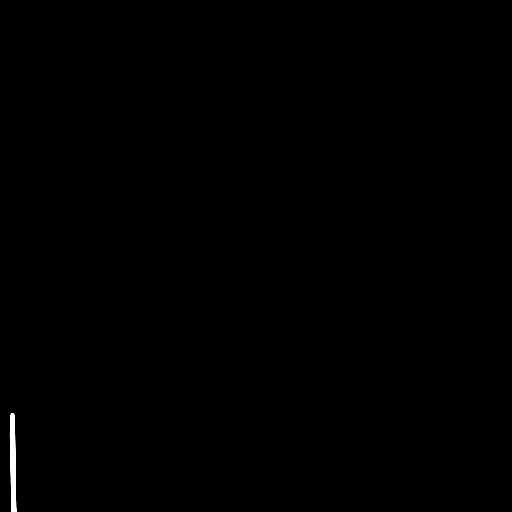
[im 32/35  bone]
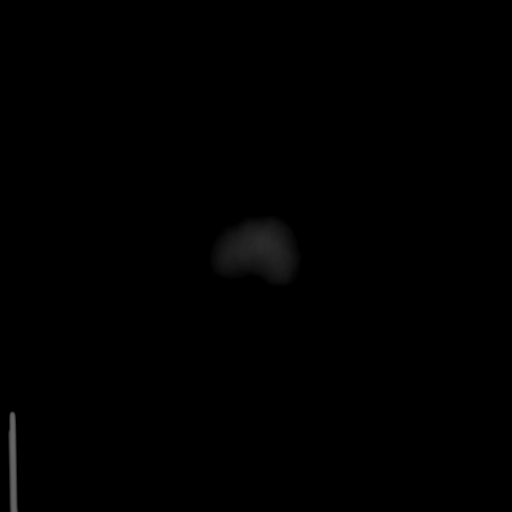

[Series 4: coronal soft · coronal · 0.31mm/px · 3 of 70 slices shown]
[im 24/70  brain]
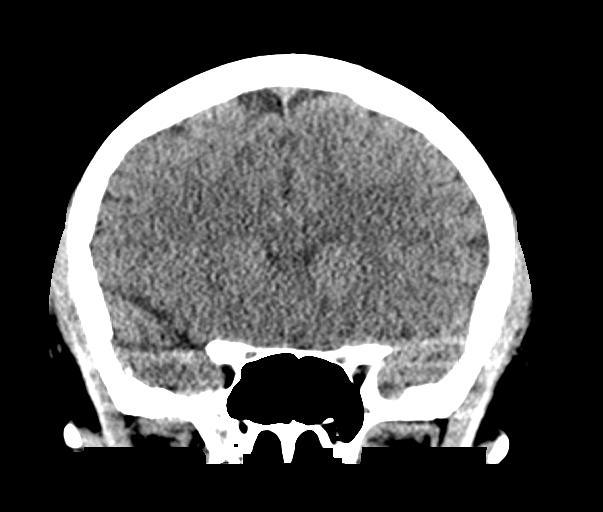
[im 31/70  brain]
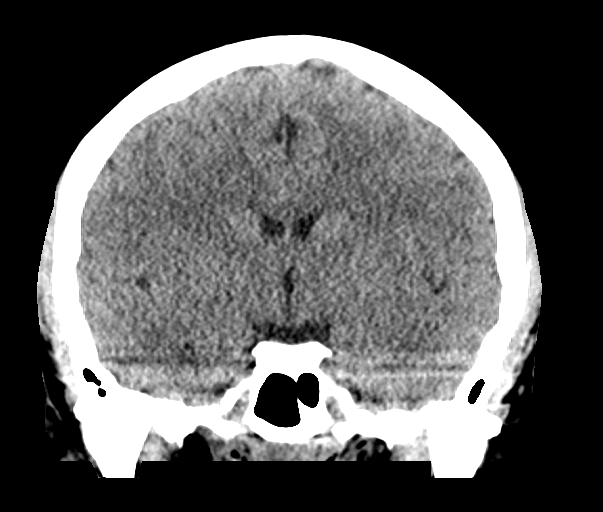
[im 39/70  brain]
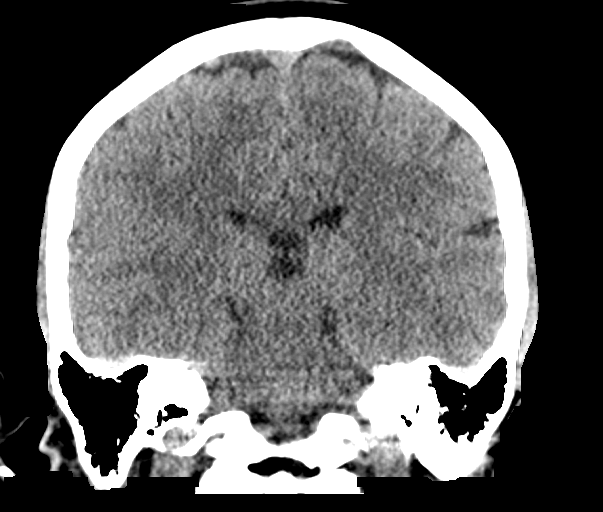

[Series 5: sagittal soft · sagittal · 0.31mm/px · 3 of 57 slices shown]
[im 19/57  brain]
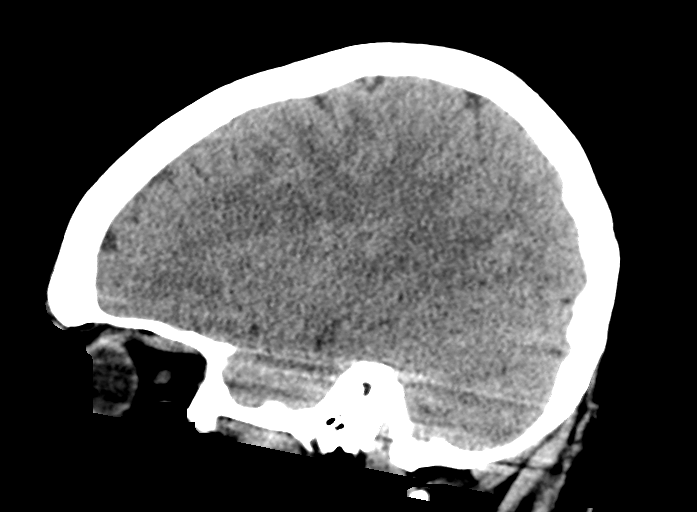
[im 29/57  brain]
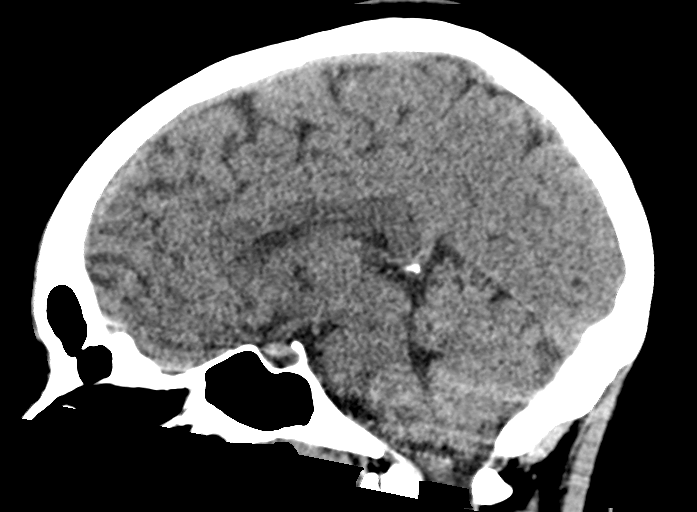
[im 38/57  brain]
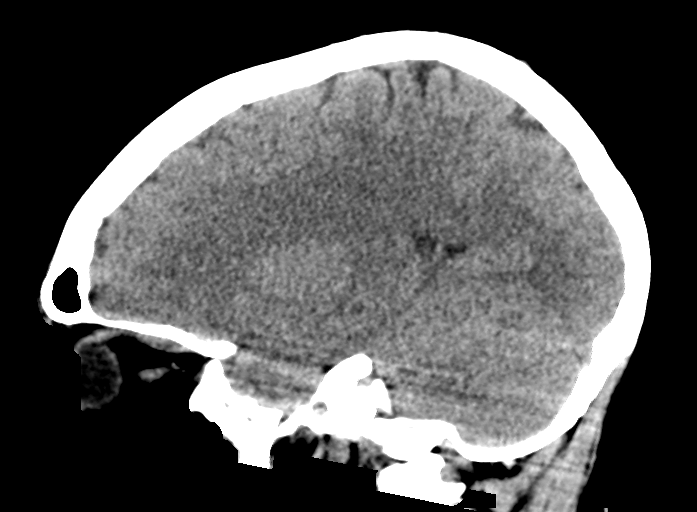

[15 of 47 positions shown; findings below may reference images not displayed]

FINDINGS: Brain: No evidence of acute infarction, hemorrhage, hydrocephalus,
extra-axial collection or mass lesion/mass effect.

Vascular: No hyperdense vessel or unexpected calcification.

Skull: Normal. Negative for fracture or focal lesion.

Sinuses/Orbits: No acute finding.

Other: None
IMPRESSION: Negative non contrasted CT appearance of the brain
